# Patient Record
Sex: Female | Born: 1970 | Race: White | Hispanic: No | Marital: Married | Smoking: Never smoker
Health system: Southern US, Community
[De-identification: ages and names within clinical notes are randomized; demographics above are authoritative.]

## PROBLEM LIST (undated history)

## (undated) DIAGNOSIS — I889 Nonspecific lymphadenitis, unspecified: Secondary | ICD-10-CM

## (undated) DIAGNOSIS — E119 Type 2 diabetes mellitus without complications: Secondary | ICD-10-CM

## (undated) DIAGNOSIS — N809 Endometriosis, unspecified: Secondary | ICD-10-CM

## (undated) HISTORY — PX: ABDOMINAL HYSTERECTOMY: SHX81

## (undated) HISTORY — DX: Type 2 diabetes mellitus without complications: E11.9

## (undated) HISTORY — DX: Endometriosis, unspecified: N80.9

## (undated) HISTORY — PX: KNEE SURGERY: SHX244

## (undated) HISTORY — DX: Nonspecific lymphadenitis, unspecified: I88.9

---
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
## 1992-02-05 HISTORY — PX: OVARIAN CYST SURGERY: SHX726

## 2003-02-05 HISTORY — PX: EYE SURGERY: SHX253

## 2005-04-06 ENCOUNTER — Inpatient Hospital Stay: Payer: Self-pay

## 2006-02-04 DIAGNOSIS — E119 Type 2 diabetes mellitus without complications: Secondary | ICD-10-CM

## 2006-02-04 HISTORY — DX: Type 2 diabetes mellitus without complications: E11.9

## 2006-04-02 ENCOUNTER — Emergency Department: Payer: Self-pay | Admitting: Emergency Medicine

## 2006-04-02 ENCOUNTER — Other Ambulatory Visit: Payer: Self-pay

## 2007-04-22 ENCOUNTER — Emergency Department: Payer: Self-pay | Admitting: Emergency Medicine

## 2008-06-02 ENCOUNTER — Ambulatory Visit: Payer: Self-pay | Admitting: Otolaryngology

## 2009-02-04 HISTORY — PX: TONSILLECTOMY: SUR1361

## 2009-02-16 ENCOUNTER — Ambulatory Visit: Payer: Self-pay | Admitting: Otolaryngology

## 2009-09-25 ENCOUNTER — Emergency Department: Payer: Self-pay | Admitting: Internal Medicine

## 2010-04-18 ENCOUNTER — Ambulatory Visit: Payer: Self-pay

## 2010-05-02 ENCOUNTER — Observation Stay: Payer: Self-pay

## 2010-05-10 ENCOUNTER — Encounter: Payer: Self-pay | Admitting: Maternal & Fetal Medicine

## 2010-06-11 ENCOUNTER — Ambulatory Visit: Payer: Self-pay

## 2010-06-12 ENCOUNTER — Observation Stay: Payer: Self-pay | Admitting: Obstetrics and Gynecology

## 2010-06-12 ENCOUNTER — Ambulatory Visit: Payer: Self-pay | Admitting: Obstetrics and Gynecology

## 2010-07-11 ENCOUNTER — Inpatient Hospital Stay: Payer: Self-pay | Admitting: Obstetrics and Gynecology

## 2011-11-15 ENCOUNTER — Emergency Department: Payer: Self-pay | Admitting: Emergency Medicine

## 2011-11-15 LAB — CBC
HCT: 37.3 % (ref 35.0–47.0)
HGB: 13 g/dL (ref 12.0–16.0)
MCH: 31 pg (ref 26.0–34.0)
MCHC: 34.8 g/dL (ref 32.0–36.0)
MCV: 89 fL (ref 80–100)
WBC: 5.6 10*3/uL (ref 3.6–11.0)

## 2011-11-15 LAB — URINALYSIS, COMPLETE
Bilirubin,UR: NEGATIVE
Nitrite: NEGATIVE
Protein: NEGATIVE
Specific Gravity: 1.01 (ref 1.003–1.030)

## 2011-11-15 LAB — COMPREHENSIVE METABOLIC PANEL
Anion Gap: 9 (ref 7–16)
BUN: 14 mg/dL (ref 7–18)
EGFR (African American): >60
EGFR (Non-African Amer.): >60
Glucose: 83 mg/dL (ref 65–99)
SGOT(AST): 19 U/L (ref 15–37)
SGPT (ALT): 21 U/L (ref 12–78)

## 2011-11-15 LAB — LIPASE, BLOOD: Lipase: 166 U/L (ref 73–393)

## 2012-02-11 ENCOUNTER — Ambulatory Visit: Payer: Self-pay | Admitting: Obstetrics and Gynecology

## 2012-02-11 LAB — CBC
HCT: 39 % (ref 35.0–47.0)
HGB: 13.3 g/dL (ref 12.0–16.0)
MCH: 30 pg (ref 26.0–34.0)
MCHC: 34 g/dL (ref 32.0–36.0)
MCV: 88 fL (ref 80–100)
Platelet: 233 10*3/uL (ref 150–440)
RBC: 4.41 10*6/uL (ref 3.80–5.20)

## 2012-02-11 LAB — BASIC METABOLIC PANEL
Anion Gap: 7 (ref 7–16)
Chloride: 105 mmol/L (ref 98–107)
Co2: 25 mmol/L (ref 21–32)
Creatinine: 0.69 mg/dL (ref 0.60–1.30)
EGFR (African American): >60
Glucose: 90 mg/dL (ref 65–99)
Osmolality: 274 (ref 275–301)
Potassium: 3.8 mmol/L (ref 3.5–5.1)
Sodium: 137 mmol/L (ref 136–145)

## 2012-02-21 ENCOUNTER — Ambulatory Visit: Payer: Self-pay

## 2012-02-21 LAB — MAGNESIUM: Magnesium: 1.7 mg/dL — ABNORMAL LOW

## 2012-02-21 LAB — BASIC METABOLIC PANEL
Anion Gap: 10 (ref 7–16)
BUN: 10 mg/dL (ref 7–18)
Calcium, Total: 8.3 mg/dL — ABNORMAL LOW (ref 8.5–10.1)
Chloride: 106 mmol/L (ref 98–107)
Co2: 20 mmol/L — ABNORMAL LOW (ref 21–32)
EGFR (African American): >60
EGFR (Non-African Amer.): >60
Glucose: 164 mg/dL — ABNORMAL HIGH (ref 65–99)
Osmolality: 275 (ref 275–301)
Sodium: 136 mmol/L (ref 136–145)

## 2012-02-21 LAB — TSH: Thyroid Stimulating Horm: 0.946 u[IU]/mL

## 2012-03-20 ENCOUNTER — Emergency Department: Payer: Self-pay | Admitting: Emergency Medicine

## 2012-03-20 LAB — URINALYSIS, COMPLETE
Bilirubin,UR: NEGATIVE
Glucose,UR: NEGATIVE mg/dL (ref 0–75)
Ketone: NEGATIVE
Nitrite: NEGATIVE
Ph: 5 (ref 4.5–8.0)
Protein: NEGATIVE
RBC,UR: 33 /HPF (ref 0–5)
Specific Gravity: 1.015 (ref 1.003–1.030)
Squamous Epithelial: 1
WBC UR: 1 /HPF (ref 0–5)

## 2012-03-20 LAB — CBC
HCT: 36.9 % (ref 35.0–47.0)
HGB: 12.7 g/dL (ref 12.0–16.0)
MCH: 30.3 pg (ref 26.0–34.0)
MCHC: 34.3 g/dL (ref 32.0–36.0)
MCV: 88 fL (ref 80–100)
RBC: 4.18 10*6/uL (ref 3.80–5.20)
RDW: 13.2 % (ref 11.5–14.5)
WBC: 7.5 10*3/uL (ref 3.6–11.0)

## 2012-08-17 ENCOUNTER — Ambulatory Visit: Payer: Self-pay | Admitting: General Surgery

## 2013-02-02 ENCOUNTER — Ambulatory Visit: Payer: Self-pay | Admitting: Family Medicine

## 2013-05-09 ENCOUNTER — Ambulatory Visit: Payer: Self-pay | Admitting: Internal Medicine

## 2014-05-27 NOTE — Consult Note (Signed)
Brief Consult Note: Diagnosis: 1. Lower ext. Tremor 2. s/p Laproscopic Bilateral Salphigectomy/hyesterctomy 3. Hypothyroidism.   Patient was seen by consultant.   Consult note dictated.   Discussed with Attending MD.   Comments: 44 yo female w/ hx of hypothyroidism who came into hospital for Laproscopy Bilateral Salphingectomy/hysterectomy post-operatively developed some Lower ext involuntary movement/tremors.   1. Lower Ext. Involuntary Movements/Tremors - likely related to meds she received during surgery.  Possibly related to anesthesia.  ?? Propofol, Sevoflurane, ?? demerol.  - I really don't think any acute neurologic process is going on at this time.  - electrolytes are o.k.   - cont. PRN Valium.  If needed can consider Neuro consult in a.m.   2. Hypothyroidism - cont. synthroid.  - will check TSH  3. s/p Laproscopic Bilateral Salphingectomy/Hysterectomy.   - cont. care as per OB/GYN  Thanks for the consult.  Full Code  Job # [id number].  Electronic Signatures: [place] SirenSainani, Vivek J (MD)  (Signed 17-Jan-14 22:31)  Authored: Brief Consult Note   Last Updated: 17-Jan-14 22:31 by [place] SirenSainani, Vivek J (MD)

## 2014-05-27 NOTE — Op Note (Signed)
PATIENT NAME:  Kristi Bradley, Kristi Bradley MR#:  [id number] DATE OF BIRTH:  [birth year]  DATE OF PROCEDURE:  02/21/2012  PREOPERATIVE DIAGNOSIS: Dysmenorrhea and menorrhagia unresponsive to medical management.   POSTOPERATIVE DIAGNOSIS: Dysmenorrhea and menorrhagia unresponsive to medical management.   PROCEDURES: Laparoscopic supracervical hysterectomy and bilateral salpingectomy as well as right ovarian cystectomy.   ESTIMATED BLOOD LOSS: 50 mL.   SURGEON: Elliot Gurneyarrie C Ramsha Lonigro, M.D.   ASSISTANT: Burnis KingfisherPhillip J. Luella Cookosenow, M.D.  FINDINGS: A very irritated and pale-appearing uterus with a large yellow cyst on the right ovary with paratubal cysts bilaterally. A normal cervix post surgery.   DESCRIPTION OF PROCEDURE: The patient was taken to the Operating Room and placed in supine position. After adequate general endotracheal anesthesia was instilled, the patient was prepped and draped in the usual sterile fashion. A time-out was performed and a side-opening speculum was placed in the patient's vagina. The anterior lip of the cervix was grasped with a single-tooth tenaculum and the Hulka tenaculum was placed for manipulation. The speculum was removed. Attention was then turned to the umbilicus which was injected with Marcaine. An incision was made. Veress needle was placed. Hanging drop test, fluid instillation test, and fluid aspiration test showed proper placement of the Veress needle. CO2 was placed on low flow. When tympany was heard around the liver, CO2 was placed on high flow. The Veress needle was removed, and the 11 Xcel trocar was placed through the umbilicus. The camera was placed under direct visualization. The aforementioned findings were seen. The patient was placed in Trendelenburg and two lower ports; a 5 on the right and an 11 on the left were placed under direct visualization. The tubes were grasped and the uterotubal peritoneum was cut with the Harmonic scalpel to release the tube. The ovarian ligament was  then cut and cauterized. The round ligament was grasped, cut. The bladder flap was created. Serial bites were taken down the sides of uterus bilaterally. The uterines were grasped with the Kleppingers. The uterine arteries were cauterize. These were then cut with the Harmonic scalpel. The Harmonic scalpel was then used to cut across the uterine cervical junction. The lower left trocar was removed and the morcellator was placed through the left side incision. The uterus was morcellated and passed off the table. The pelvis was irrigated with copious amounts of warm normal saline. Interceed was placed across the cervix. Attention was turned to the ovary which was incised. The anterior tissue was removed and passed off the table. The patient was laid supine. The CO2 was allowed to escape from her abdomen. A cone was used to make a fascial stitch in the left lower quadrant. Then 4-0 Monocryls were used to approximate the skin edges of all the incision. Dermabond was placed. Tegaderms were pledget. The Hulka tenaculum was removed. Clear urine was noted in the Foley bag. The ureters had been identified and the patient was laid supine and taken to recovery after having tolerated the procedure well.  ____________________________ Elliot Gurneyarrie C. Linsi Humann, MD cck:jm D: 02/28/2012 08:17:14 ET T: 02/28/2012 10:12:07 ET JOB#: [id number]  cc: Elliot Gurneyarrie C. Kenitra Leventhal, MD, <Dictator> Elliot GurneyARRIE C Efrain Clauson MD ELECTRONICALLY SIGNED 03/18/2012 0:20

## 2014-05-27 NOTE — Consult Note (Signed)
PATIENT NAME:  Kristi PortelaROGERS, Arretta R MR#:  [id number] DATE OF BIRTH:  04-24-1970  DATE OF CONSULTATION:  02/21/2012  REFERRING PHYSICIAN:  Adria Devonarrie Klett, MD CONSULTING PHYSICIAN:  Rolly PancakeVivek J. Cherlynn KaiserSainani, MD  PRIMARY CARE PHYSICIAN: Unknown.   REASON FOR CONSULTATION: Involuntary lower extremity movement/tremor.   HISTORY OF PRESENT ILLNESS: This is a 44 year old female who came to the hospital today for a laparoscopic bilateral salpingectomy and hysterectomy. Postoperatively, a few hours later, the patient developed some significant lower extremity tremor and involuntary movements. The patient received some Demerol, although it did not seem to alleviate her shakes or symptoms. Hospitalist services were contacted for treatment and evaluation. The patient presently denies any headache. She denies any blurry vision. She denies any numbness or tingling. She denies any focal weakness. She does have these involuntary movements, in her lower extremities. She is afebrile. She denies any other associated symptoms presently.   REVIEW OF SYSTEMS:  CONSTITUTIONAL: No documented fever. No weight gain or weight loss.  EYES: Denies blurry or double vision.   ENT: No tinnitus. No postnasal drip. No redness of the oropharynx.   RESPIRATORY: No cough. No wheeze. No hemoptysis. No dyspnea.   CARDIOVASCULAR: No chest pain. No orthopnea. No palpitations. No syncope.   GASTROINTESTINAL: No nausea. No vomiting. No diarrhea. No abdominal pain. No melena or hematochezia.   GENITOURINARY: No dysuria or hematuria.   ENDOCRINE: No polyuria or nocturia. No heat or cold intolerance.   HEMATOLOGIC: No anemia. No bruising. No bleeding.   INTEGUMENTARY: No rashes. No lesions.   MUSCULOSKELETAL: No arthritis. No swelling. No gout.   NEUROLOGIC: No numbness. No tingling. No ataxia. No seizure-type activity. Positive tremor.   PSYCH: No anxiety. No insomnia. No ADD.   PAST MEDICAL HISTORY: Hypothyroidism.   ALLERGIES:  OXYCODONE, TETRACYCLINE.   SOCIAL HISTORY: No smoking. No alcohol abuse. No illicit drug abuse. Lives at home with her husband.   FAMILY HISTORY: The patient's mother and father are both alive. Father has many medical problems including heart disease and diabetes. Mother just has some hypothyroidism.   CURRENT MEDICATIONS: 1. Centrum multivitamin daily. 2. Synthroid 50 mcg daily. 3. Vitamin C 500 mg daily.  4. Vitamin D3 1000 international units daily.   PHYSICAL EXAMINATION:  VITALS: Temperature 98.6, pulse 100, respirations 20, blood pressure 111/64 and sats 96% on room air.   GENERAL: She is a pleasant appearing female in mild distress.   HEENT: Atraumatic, normocephalic. Extraocular muscles are intact. Pupils are equal and reactive to light. Sclerae anicteric. No conjunctival injection. No pharyngeal erythema.   NECK: Supple. No jugular venous distention, no bruits, no lymphadenopathy and no thyromegaly.   CARDIOVASCULAR: Heart has regular rate and rhythm, tachycardic. No murmurs, no rubs and no clicks.   RESPIRATORY: Lungs are clear to auscultation bilaterally. No rales, no rhonchi and no wheezes.   ABDOMEN: Soft, flat, nontender and nondistended. Has good bowel sounds. No hepatosplenomegaly appreciated.   EXTREMITIES: No evidence of any cyanosis, clubbing or peripheral edema. Has +2 pedal and radial pulses bilaterally.   NEUROLOGICAL: The patient is alert, awake and oriented x 3. No focal motor or sensory deficits appreciated bilaterally. She does have a tremor and involuntary twitch in her lower extremities.   SKIN: Moist and warm with no rashes.   LYMPHATIC: There is no cervical or axillary lymphadenopathy.  LABORATORY STUDIES: Serum glucose 164, BUN 10, creatinine 0.8, sodium 136, potassium 3.7, chloride 106 and bicarbonate 20. Magnesium was 1.7.   ASSESSMENT AND PLAN: This is  a 43 year old female with a history of hypothyroidism who presents to the hospital for a  laparoscopic bilateral salpingectomy and hysterectomy. Postoperatively she developed some lower extremity involuntary movements/tremor.  1. Lower extremity involuntary movements/tremor. This is likely related to medications she received during surgery, possibly related to the anesthesia. Propofol and sevoflurane and Demerol are all associated with either shivering or involuntary muscle movements, although the propofol should have left her system by now, sevoflurane causes shivering, but she only has lower extremity movements, unlikely to be sevoflurane, questionable if it is related to Demerol, although her last dose of Demerol was a few hours ago and half-life is only about 4 hours. I really do not think there is any acute neurologic process going on as she is alert and awake and has good motor strength with no sensory abnormalities. The patient's electrolytes are also within good range, magnesium is only 1.7 and potassium is 3.2, but calcium is mildly low. I would consider p.r.n. Valium for now. If she continues to have tremor and movements would consider getting neurology involved in the morning.  2. Hypothyroidism. We will continue her Synthroid. I will go ahead and check a TSH. 3. Status-post laparoscopic bilateral salpingectomy/hysterectomy. Continue care as per OB/GYN.  Thank you so much for the consultation.   TIME SPENT: 50 minutes.  ____________________________ Rolly Pancake. Cherlynn Kaiser, MD vjs:sb D: 02/21/2012 22:30:56 ET T: 02/22/2012 14:53:35 ET JOB#: [id number]  cc: Rolly Pancake. Cherlynn Kaiser, MD, <Dictator> [place] Siren MD ELECTRONICALLY SIGNED 02/22/2012 17:37

## 2014-07-29 ENCOUNTER — Other Ambulatory Visit: Payer: Self-pay | Admitting: Ophthalmology

## 2014-07-29 DIAGNOSIS — H5340 Unspecified visual field defects: Secondary | ICD-10-CM

## 2014-08-03 ENCOUNTER — Ambulatory Visit: Payer: BLUE CROSS/BLUE SHIELD

## 2015-03-14 ENCOUNTER — Other Ambulatory Visit: Payer: Self-pay | Admitting: Family Medicine

## 2015-03-14 DIAGNOSIS — Z1231 Encounter for screening mammogram for malignant neoplasm of breast: Secondary | ICD-10-CM

## 2015-04-19 ENCOUNTER — Ambulatory Visit
Admission: RE | Admit: 2015-04-19 | Discharge: 2015-04-19 | Disposition: A | Discharge status: Home / Self Care | Payer: BLUE CROSS/BLUE SHIELD | Source: Ambulatory Visit | Attending: Family Medicine | Admitting: Family Medicine

## 2015-04-19 DIAGNOSIS — Z1231 Encounter for screening mammogram for malignant neoplasm of breast: Secondary | ICD-10-CM | POA: Diagnosis not present

## 2017-02-07 ENCOUNTER — Other Ambulatory Visit: Payer: Self-pay | Admitting: Family Medicine

## 2017-02-10 ENCOUNTER — Other Ambulatory Visit: Payer: Self-pay | Admitting: Family Medicine

## 2017-02-10 DIAGNOSIS — R0989 Other specified symptoms and signs involving the circulatory and respiratory systems: Secondary | ICD-10-CM

## 2017-02-10 DIAGNOSIS — R198 Other specified symptoms and signs involving the digestive system and abdomen: Secondary | ICD-10-CM

## 2017-02-10 DIAGNOSIS — E0789 Other specified disorders of thyroid: Secondary | ICD-10-CM

## 2017-02-19 ENCOUNTER — Other Ambulatory Visit: Payer: Self-pay | Admitting: Family Medicine

## 2017-02-19 DIAGNOSIS — Z1231 Encounter for screening mammogram for malignant neoplasm of breast: Secondary | ICD-10-CM

## 2017-03-11 ENCOUNTER — Ambulatory Visit: Payer: BLUE CROSS/BLUE SHIELD

## 2017-03-18 ENCOUNTER — Other Ambulatory Visit: Payer: Self-pay | Admitting: Physical Medicine and Rehabilitation

## 2017-03-18 DIAGNOSIS — M5412 Radiculopathy, cervical region: Secondary | ICD-10-CM

## 2017-03-19 ENCOUNTER — Ambulatory Visit
Admission: RE | Admit: 2017-03-19 | Discharge: 2017-03-19 | Disposition: A | Discharge status: Home / Self Care | Payer: BLUE CROSS/BLUE SHIELD | Source: Ambulatory Visit | Attending: Family Medicine | Admitting: Family Medicine

## 2017-03-19 ENCOUNTER — Encounter (INDEPENDENT_AMBULATORY_CARE_PROVIDER_SITE_OTHER): Payer: Self-pay

## 2017-03-19 DIAGNOSIS — Z1231 Encounter for screening mammogram for malignant neoplasm of breast: Secondary | ICD-10-CM

## 2017-04-01 ENCOUNTER — Ambulatory Visit
Admission: RE | Admit: 2017-04-01 | Discharge: 2017-04-01 | Disposition: A | Discharge status: Home / Self Care | Payer: BLUE CROSS/BLUE SHIELD | Source: Ambulatory Visit | Attending: Physical Medicine and Rehabilitation | Admitting: Physical Medicine and Rehabilitation

## 2017-04-01 DIAGNOSIS — M5412 Radiculopathy, cervical region: Secondary | ICD-10-CM

## 2019-04-30 ENCOUNTER — Other Ambulatory Visit: Payer: Self-pay | Admitting: Family Medicine

## 2019-04-30 DIAGNOSIS — Z1231 Encounter for screening mammogram for malignant neoplasm of breast: Secondary | ICD-10-CM

## 2019-06-27 IMAGING — MG MM DIGITAL SCREENING BILAT W/ CAD
4 series · 4 of 4 positions shown · non-contrast
Comparison: Previous exam(s).

CLINICAL DATA: Screening.

EXAM:
DIGITAL SCREENING BILATERAL MAMMOGRAM WITH CAD

[R MLO]
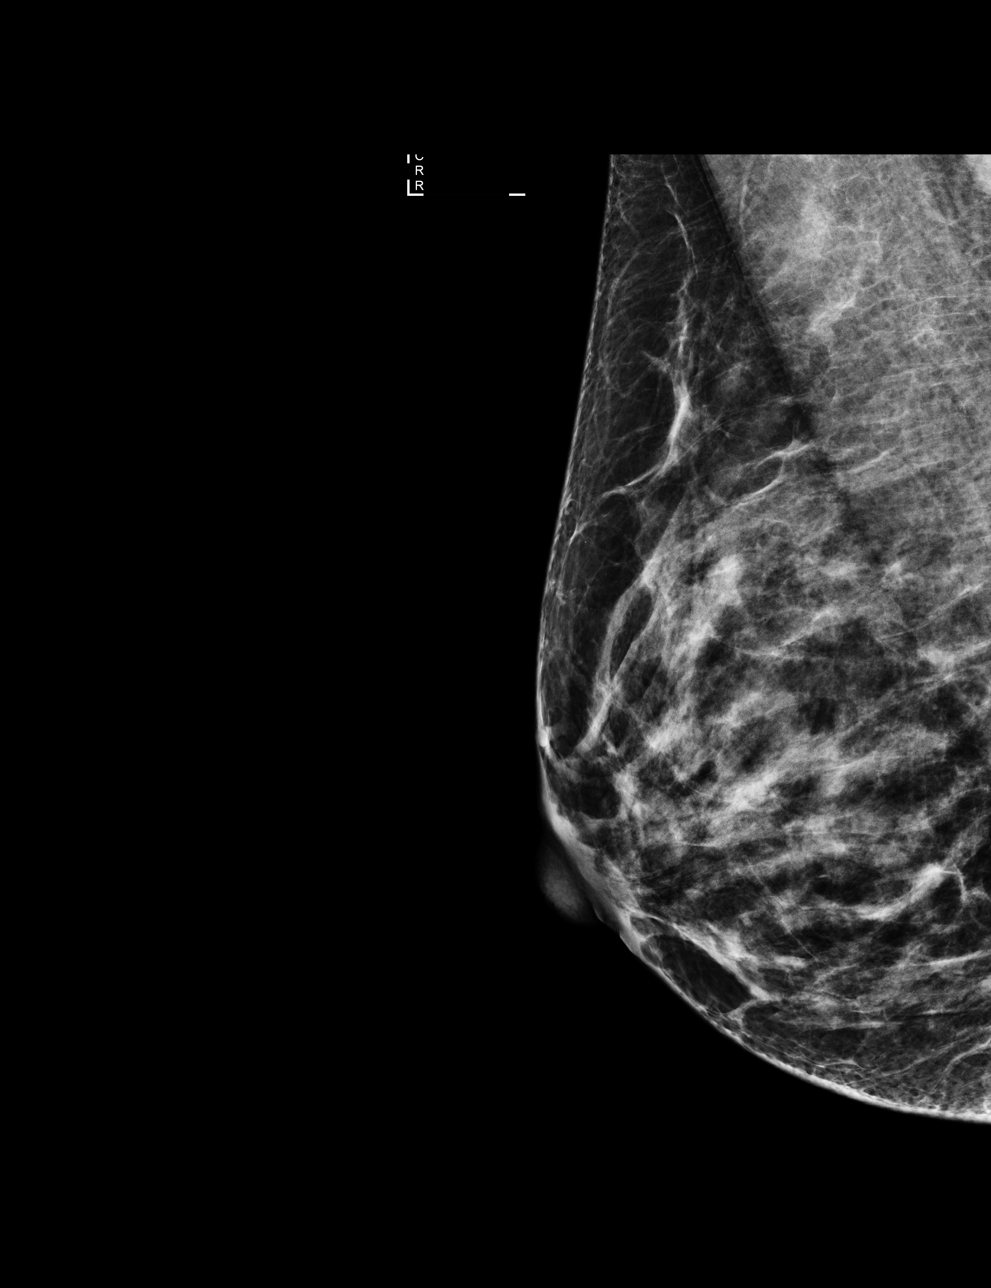

[L CC]
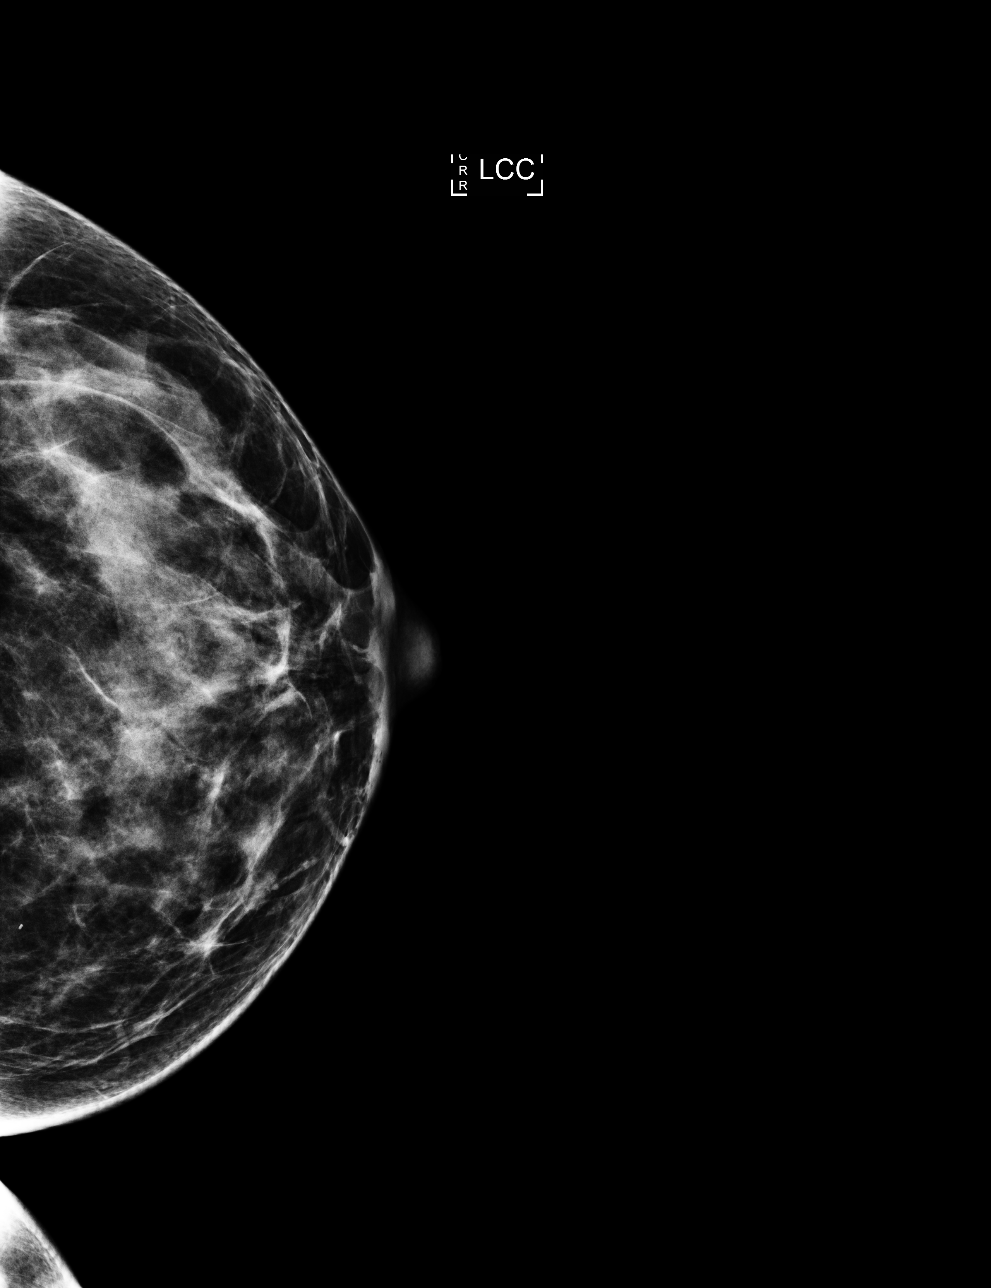

[L MLO]
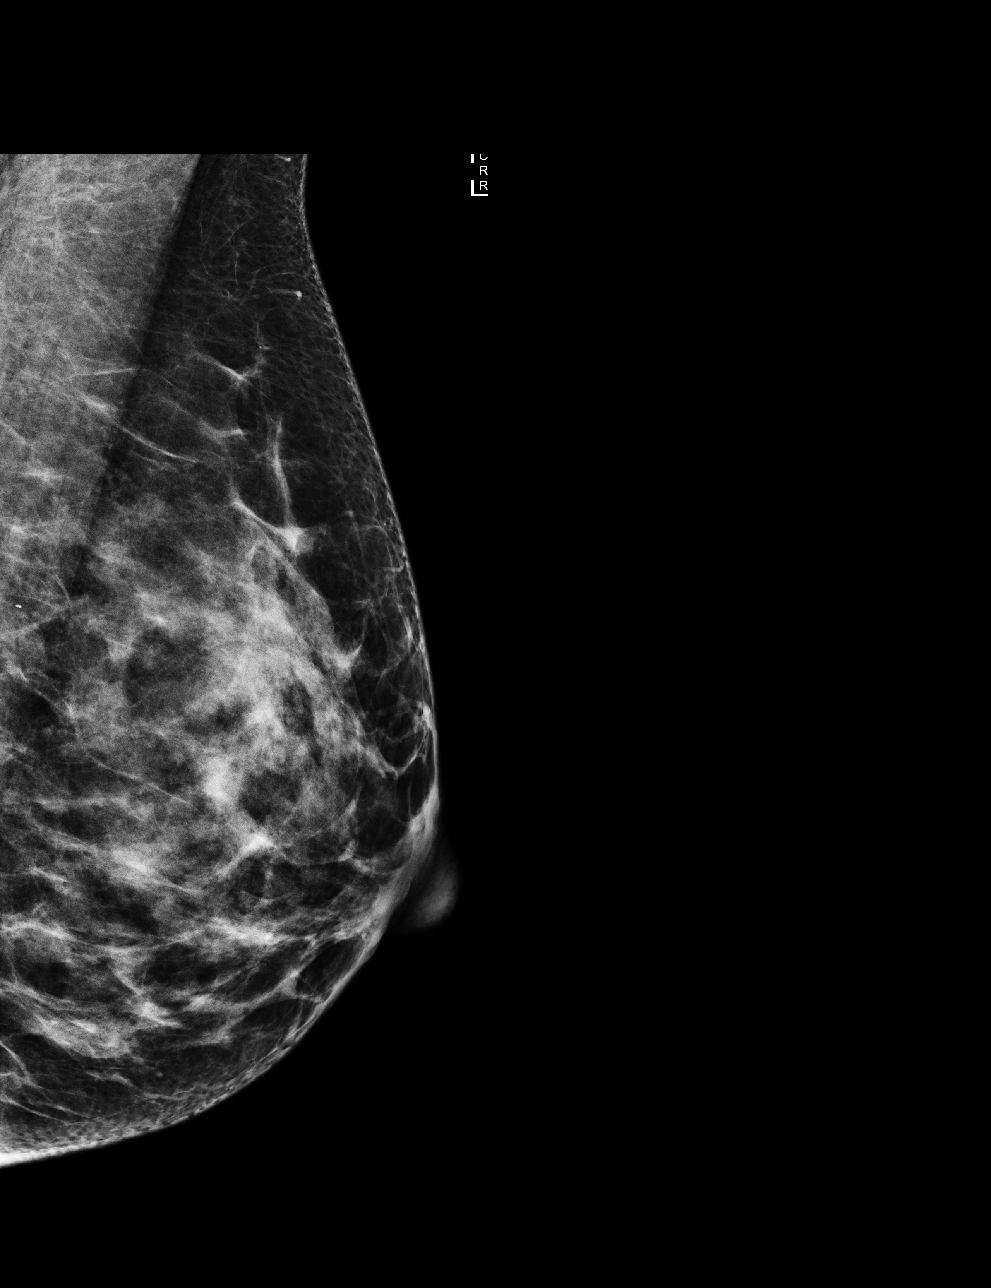

[R CC]
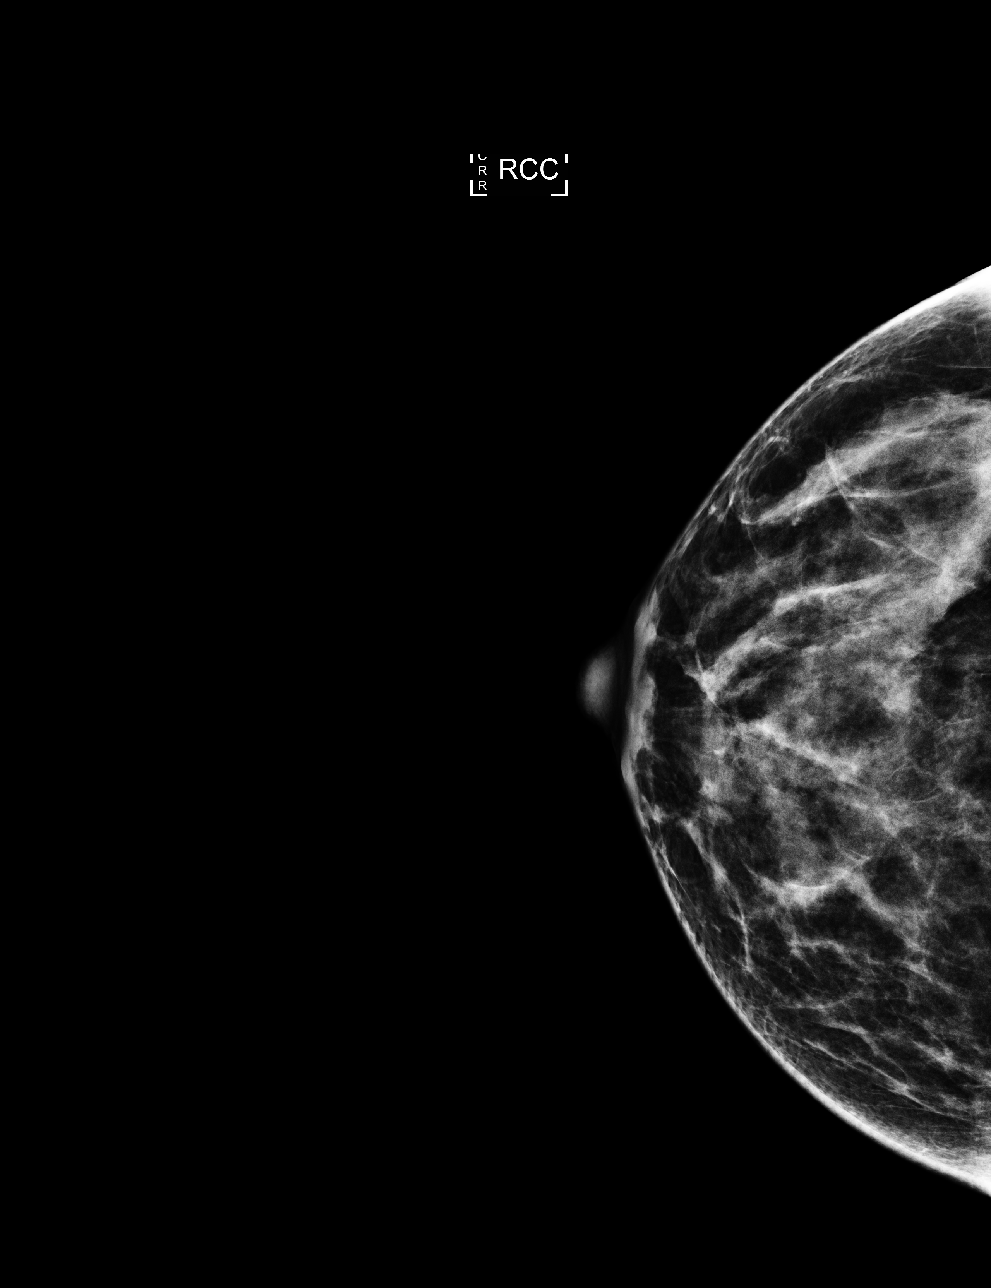

[4 of 4 positions shown; findings below may reference images not displayed]

ACR Breast Density Category c: The breast tissue is heterogeneously
dense, which may obscure small masses.
FINDINGS: There are no findings suspicious for malignancy. Images were
processed with CAD.
IMPRESSION: No mammographic evidence of malignancy. A result letter of this
screening mammogram will be mailed directly to the patient.

RECOMMENDATION:
Screening mammogram in one year. (Code:YJ-[id number])

BI-RADS CATEGORY  1: Negative.

## 2019-07-27 ENCOUNTER — Other Ambulatory Visit: Payer: Self-pay | Admitting: Obstetrics and Gynecology

## 2019-07-27 DIAGNOSIS — N644 Mastodynia: Secondary | ICD-10-CM

## 2019-08-06 ENCOUNTER — Ambulatory Visit
Admission: RE | Admit: 2019-08-06 | Discharge: 2019-08-06 | Disposition: A | Discharge status: Home / Self Care | Payer: 59 | Source: Ambulatory Visit | Attending: Obstetrics and Gynecology | Admitting: Obstetrics and Gynecology

## 2019-08-06 DIAGNOSIS — N644 Mastodynia: Secondary | ICD-10-CM

## 2020-07-27 ENCOUNTER — Other Ambulatory Visit: Payer: Self-pay | Admitting: Obstetrics and Gynecology

## 2020-07-27 DIAGNOSIS — Z1231 Encounter for screening mammogram for malignant neoplasm of breast: Secondary | ICD-10-CM

## 2020-08-11 ENCOUNTER — Other Ambulatory Visit: Payer: Self-pay

## 2020-08-11 ENCOUNTER — Ambulatory Visit
Admission: RE | Admit: 2020-08-11 | Discharge: 2020-08-11 | Disposition: A | Discharge status: Home / Self Care | Payer: 59 | Source: Ambulatory Visit | Attending: Obstetrics and Gynecology | Admitting: Obstetrics and Gynecology

## 2020-08-11 DIAGNOSIS — Z1231 Encounter for screening mammogram for malignant neoplasm of breast: Secondary | ICD-10-CM | POA: Diagnosis not present

## 2021-02-27 DIAGNOSIS — E663 Overweight: Secondary | ICD-10-CM | POA: Diagnosis not present

## 2021-04-03 DIAGNOSIS — Z6828 Body mass index (BMI) 28.0-28.9, adult: Secondary | ICD-10-CM | POA: Diagnosis not present

## 2021-04-03 DIAGNOSIS — E663 Overweight: Secondary | ICD-10-CM | POA: Diagnosis not present

## 2021-10-12 ENCOUNTER — Other Ambulatory Visit: Payer: Self-pay | Admitting: Family Medicine

## 2021-10-12 DIAGNOSIS — Z1231 Encounter for screening mammogram for malignant neoplasm of breast: Secondary | ICD-10-CM

## 2021-11-09 ENCOUNTER — Ambulatory Visit
Admission: RE | Admit: 2021-11-09 | Discharge: 2021-11-09 | Disposition: A | Discharge status: Home / Self Care | Payer: 59 | Source: Ambulatory Visit | Attending: Family Medicine | Admitting: Family Medicine

## 2021-11-09 DIAGNOSIS — Z1231 Encounter for screening mammogram for malignant neoplasm of breast: Secondary | ICD-10-CM | POA: Insufficient documentation
